# Patient Record
Sex: Male | Born: 1970 | Race: Black or African American | Hispanic: No | Marital: Married | Smoking: Never smoker
Health system: Southern US, Community
[De-identification: ages and names within clinical notes are randomized; demographics above are authoritative.]

## PROBLEM LIST (undated history)

## (undated) DIAGNOSIS — R569 Unspecified convulsions: Secondary | ICD-10-CM

## (undated) DIAGNOSIS — T7840XA Allergy, unspecified, initial encounter: Secondary | ICD-10-CM

## (undated) HISTORY — DX: Allergy, unspecified, initial encounter: T78.40XA

## (undated) HISTORY — DX: Unspecified convulsions: R56.9

---

## 2015-09-28 ENCOUNTER — Ambulatory Visit (INDEPENDENT_AMBULATORY_CARE_PROVIDER_SITE_OTHER): Admitting: Family Medicine

## 2015-09-28 ENCOUNTER — Ambulatory Visit (INDEPENDENT_AMBULATORY_CARE_PROVIDER_SITE_OTHER): Payer: 59 | Admitting: Family Medicine

## 2015-09-28 VITALS — BP 118/80 | HR 66 | Temp 98.7°F | Resp 18 | Ht 72.75 in | Wt 216.4 lb

## 2015-09-28 DIAGNOSIS — R5381 Other malaise: Secondary | ICD-10-CM

## 2015-09-28 DIAGNOSIS — S39012A Strain of muscle, fascia and tendon of lower back, initial encounter: Secondary | ICD-10-CM

## 2015-09-28 DIAGNOSIS — R509 Fever, unspecified: Secondary | ICD-10-CM | POA: Diagnosis not present

## 2015-09-28 DIAGNOSIS — R05 Cough: Secondary | ICD-10-CM

## 2015-09-28 DIAGNOSIS — M545 Low back pain: Secondary | ICD-10-CM

## 2015-09-28 DIAGNOSIS — R059 Cough, unspecified: Secondary | ICD-10-CM

## 2015-09-28 DIAGNOSIS — J111 Influenza due to unidentified influenza virus with other respiratory manifestations: Secondary | ICD-10-CM | POA: Diagnosis not present

## 2015-09-28 LAB — POCT CBC
Granulocyte percent: 71.8 %G (ref 37–80)
HEMATOCRIT: 40 % — AB (ref 43.5–53.7)
Hemoglobin: 13.9 g/dL — AB (ref 14.1–18.1)
LYMPH, POC: 1.2 (ref 0.6–3.4)
MCH, POC: 31.1 pg (ref 27–31.2)
MCHC: 34.8 g/dL (ref 31.8–35.4)
MCV: 89.5 fL (ref 80–97)
MID (CBC): 0.4 (ref 0–0.9)
MPV: 6.6 fL (ref 0–99.8)
POC GRANULOCYTE: 4 (ref 2–6.9)
POC LYMPH %: 20.8 % (ref 10–50)
POC MID %: 7.4 % (ref 0–12)
Platelet Count, POC: 204 10*3/uL (ref 142–424)
RBC: 4.47 M/uL — AB (ref 4.69–6.13)
RDW, POC: 13.6 %
WBC: 5.6 10*3/uL (ref 4.6–10.2)

## 2015-09-28 LAB — POCT RAPID STREP A (OFFICE): RAPID STREP A SCREEN: NEGATIVE

## 2015-09-28 LAB — POCT INFLUENZA A/B
INFLUENZA A, POC: NEGATIVE
INFLUENZA B, POC: NEGATIVE

## 2015-09-28 MED ORDER — OSELTAMIVIR PHOSPHATE 75 MG PO CAPS: 75.0000 mg | ORAL_CAPSULE | Freq: Two times a day (BID) | ORAL | Status: DC

## 2015-09-28 MED ORDER — HYDROCODONE-HOMATROPINE 5-1.5 MG/5ML PO SYRP: 5.0000 mL | ORAL_SOLUTION | Freq: Three times a day (TID) | ORAL | Status: DC | PRN

## 2015-09-28 MED ORDER — IBUPROFEN 800 MG PO TABS: 800.0000 mg | ORAL_TABLET | Freq: Three times a day (TID) | ORAL | Status: DC | PRN

## 2015-09-28 NOTE — Progress Notes (Signed)
Donald Rodgers 07/03/1971 45 y.o.   Chief Complaint  Patient presents with  . Back Injury    Pt. was lifting heavy mail at work and felt a pull in his lower back     Date of Injury: 09/28/15  History of Present Illness:  Presents for evaluation of work-related complaint. He was at work today and lifted some mail- he is used to lifting a lot at work and did not think too much about it.  He lifted a tray of mail and noted that he felt a little strange in his back.  When he went to bend down and put the mail tray into a hamper he had worsening pain.  He was then standing up casing his mail and felt a strange tightening in his back.  He tried to keep on going but his back hurt too bad so he came in to be seen The pain is in the mid to right lower back.  It does not go into his legs.  It is most painful when he is sitting.   Driving is painful No leg numbness or weakness from this accident.   No history of back trouble  He is very active and runs for exercise, he is quite fit generally  He took some ibuprofen earlier today  ROS    Allergies  Allergen Reactions  . Sulfur      Current medications reviewed and updated. Past medical history, family history, social history have been reviewed and updated.   Physical Exam Filed Vitals:   09/28/15 1613  BP: 118/80  Pulse: 66  Temp: 98.7 F (37.1 C)  Resp: 18   GEN: WDWN, NAD, Non-toxic, A & O x 3, looks well, fit build HEENT: Atraumatic, Normocephalic. Neck supple. No masses, No LAD. Ears and Nose: No external deformity. CV: RRR, No M/G/R. No JVD. No thrill. No extra heart sounds. PULM: CTA B, no wheezes, crackles, rhonchi. No retractions. No resp. distress. No accessory muscle use. EXTR: No c/c/e NEURO Normal gait.  PSYCH: Normally interactive. Conversant. Not depressed or anxious appearing.  Calm demeanor.  He indicates the bilateral lower back muscles as the area of tenderness  Spinal flexion is limited by pain,  Normal  BLE strength, sensation and DTR.  He does not have saddle anesthesia   Assessment and Plan:  Lumbar strain, initial encounter - Plan: ibuprofen (ADVIL,MOTRIN) 800 MG tablet  He will be out of work this weekend and will recheck on Monday Ibuprofen as neededfor pain

## 2015-09-28 NOTE — Progress Notes (Signed)
[facility] [address] [place] [address] [phone]  Date:  09/28/2015   Name:  Donald Rodgers   DOB:  07-07-1971   MRN:  [id number]  PCP:  No primary care provider on file.    Chief Complaint: URI   History of Present Illness:  Donald Rodgers is a 45 y.o. very pleasant male patient who presents with the following:  He is here as a new patent.  He has epilepsy- he uses keppra and has not had a seizure in several years.  He drives, etc.  Works for the [employer] Otherwise he is generally healthy His neurologist is in [facility]  He has been ill with cough, sweating, chills, phlegm in his throat.  He has been sick for about 2 days.  Sx came on him quite suddenly He did have a fever to 102 earlier today.   He feels achy and exhauseted.  He did take some ibuprofen just about an hour ago He felt nauseated but has not vomited.  Poor appetite.   His daughter was ill recently with a stomach bug of some type.  She is now better  There are no active problems to display for this patient.   Past Medical History  Diagnosis Date  . Allergy   . Seizures (HCC)     History reviewed. No pertinent past surgical history.  Social History  Substance Use Topics  . Smoking status: Never Smoker   . Smokeless tobacco: None  . Alcohol Use: No    Family History  Problem Relation Age of Onset  . Stroke Mother   . Cancer Father     Allergies  Allergen Reactions  . Sulfur     Medication list has been reviewed and updated.  No current outpatient prescriptions on file prior to visit.   No current facility-administered medications on file prior to visit.    Review of Systems:  As per HPI- otherwise negative.   Physical Examination: Filed Vitals:   09/28/15 1611  BP: 118/80  Pulse: 66  Temp: 98.7 F (37.1 C)  Resp: 18   Filed Vitals:   09/28/15 1611  Height: 6' 0.75" (1.848 m)  Weight: 216 lb 6.4 oz (98.158 kg)   Body mass index is  28.74 kg/(m^2). Ideal Body Weight: Weight in (lb) to have BMI = 25: 187.8  GEN: WDWN, NAD, Non-toxic, A & O x 3, looks well,fit build HEENT: Atraumatic, Normocephalic. Neck supple. No masses, No LAD.  Bilateral TM wnl, oropharynx normal.  PEERL,EOMI.   Ears and Nose: No external deformity. CV: RRR, No M/G/R. No JVD. No thrill. No extra heart sounds. PULM: CTA B, no wheezes, crackles, rhonchi. No retractions. No resp. distress. No accessory muscle use. ABD: S, NT, ND. No rebound. No HSM.  Belly benign EXTR: No c/c/e NEURO Normal gait.  PSYCH: Normally interactive. Conversant. Not depressed or anxious appearing.  Calm demeanor.   Results for orders placed or performed in visit on 09/28/15  POCT Influenza A/B  Result Value Ref Range   Influenza A, POC Negative Negative   Influenza B, POC Negative Negative  POCT rapid strep A  Result Value Ref Range   Rapid Strep A Screen Negative Negative  POCT CBC  Result Value Ref Range   WBC 5.6 4.6 - 10.2 K/uL   Lymph, poc 1.2 0.6 - 3.4   POC LYMPH PERCENT 20.8 10 - 50 %L   MID (cbc) 0.4 0 - 0.9   POC  MID % 7.4 0 - 12 %M   POC Granulocyte 4.0 2 - 6.9   Granulocyte percent 71.8 37 - 80 %G   RBC 4.47 (A) 4.69 - 6.13 M/uL   Hemoglobin 13.9 (A) 14.1 - 18.1 g/dL   HCT, POC 16.1 (A) 09.6 - 53.7 %   MCV 89.5 80 - 97 fL   MCH, POC 31.1 27 - 31.2 pg   MCHC 34.8 31.8 - 35.4 g/dL   RDW, POC 04.5 %   Platelet Count, POC 204 142 - 424 K/uL   MPV 6.6 0 - 99.8 fL    Assessment and Plan: Influenza with respiratory manifestation - Plan: oseltamivir (TAMIFLU) 75 MG capsule  Cough - Plan: POCT Influenza A/B, POCT rapid strep A, POCT CBC, HYDROcodone-homatropine (HYCODAN) 5-1.5 MG/5ML syrup  Malaise - Plan: POCT Influenza A/B, POCT rapid strep A, POCT CBC  Fever, unspecified - Plan: POCT Influenza A/B, POCT rapid strep A, POCT CBC  Here today with likely flu. Will treat with tamiflu and hycodan/  He will rest, drink fluids and let me know if not better  soon   Signed Abbe Amsterdam, MD

## 2015-09-28 NOTE — Patient Instructions (Signed)
You will be out of work this weekend- please see [place] on Monday for a recheck.  Use the ibuprofen 800 mg as needed for back pain.  Do not take other ibuprofen or aleve with this medication!  Take it easy on your back and rest

## 2015-09-28 NOTE — Patient Instructions (Signed)
You likely have the flu  Rest and drink plenty of fluids Use the tamiflu as directed and the hycodan cough syrup as needed for cough and pain The cough syrup will make you sleepy so do not use it when you need to drive  If you are not feeling better in the next few days or if you start to get worse please let us know- the flu can lead to complications such as pneumonia so do seek care if you get worse!    You can also use NSAIDS such as ibuprofen and/ or tylenol for fever or pain

## 2015-10-01 ENCOUNTER — Ambulatory Visit

## 2015-10-01 ENCOUNTER — Ambulatory Visit (INDEPENDENT_AMBULATORY_CARE_PROVIDER_SITE_OTHER): Admitting: Family Medicine

## 2015-10-01 VITALS — BP 106/66 | HR 62 | Temp 98.7°F | Resp 18 | Ht 72.0 in | Wt 216.0 lb

## 2015-10-01 DIAGNOSIS — X503XXA Overexertion from repetitive movements, initial encounter: Secondary | ICD-10-CM

## 2015-10-01 DIAGNOSIS — S39012A Strain of muscle, fascia and tendon of lower back, initial encounter: Secondary | ICD-10-CM

## 2015-10-01 DIAGNOSIS — S39012D Strain of muscle, fascia and tendon of lower back, subsequent encounter: Secondary | ICD-10-CM

## 2015-10-01 DIAGNOSIS — M545 Low back pain, unspecified: Secondary | ICD-10-CM

## 2015-10-01 DIAGNOSIS — M5442 Lumbago with sciatica, left side: Secondary | ICD-10-CM

## 2015-10-01 MED ORDER — CYCLOBENZAPRINE HCL 10 MG PO TABS: 10.0000 mg | ORAL_TABLET | Freq: Two times a day (BID) | ORAL | Status: DC | PRN

## 2015-10-01 NOTE — Patient Instructions (Addendum)
You do have a low back strain. I think this will improve but we do not want you lifting too much.   Use the flexeril as needed for the back pain- however this will make you sleepy!  Do not use it when you need to drive and do not combine with the cough syrup Please see [place] in one week for a recheck Heat and gentle exercise may also be helpful    Because you received an x-ray today, you will receive an invoice from [facility]. Please contact [facility] at [phone] with questions or concerns regarding your invoice. Our billing staff will not be able to assist you with those questions.

## 2015-10-01 NOTE — Progress Notes (Signed)
Donald Rodgers [birth year] 45 y.o.   Chief Complaint  Patient presents with  . Follow-up    workers comp. back injury, brought in paper to gt clearance      Date of Injury: 09/28/15  History of Present Illness:  Presents for evaluation of work-related complaint.  Seen on 1/27 following a lumbar strain. He notes that his back is improved but not yet well. The worst sx are in his left lower back.  He may have some radiation into his left leg.  He had a couple of episodes where the pain did intensify momentarily and nearly took the strength from the leg, but otherwise he does not have any weakness.  No numbness of the leg  No bowel or bladder dysfunction The pain is improved and he would like to get back to work but thinks he will need light duty for a while at least  ROS    Allergies  Allergen Reactions  . Sulfur      Current medications reviewed and updated. Past medical history, family history, social history have been reviewed and updated.   Physical Exam   Filed Vitals:   10/01/15 1243  BP: 106/66  Pulse: 62  Temp: 98.7 F (37.1 C)  Resp: 18   GEN: WDWN, NAD, Non-toxic, A & O x 3, well and healthy appearing HEENT: Atraumatic, Normocephalic. Neck supple. No masses, No LAD. Ears and Nose: No external deformity. CV: RRR, No M/G/R. No JVD. No thrill. No extra heart sounds. PULM: CTA B, no wheezes, crackles, rhonchi. No retractions. No resp. distress. No accessory muscle use. EXTR: No c/c/e NEURO Normal gait.  PSYCH: Normally interactive. Conversant. Not depressed or anxious appearing.  Calm demeanor.  Left lower back: he has tenderness and spasm of the mid lumbar muscles.  Slightly restricted flexion, normal extension Normal BLE strength, sensation and DTR, negative SLR bilaterally    [facility] reading (PRIMARY) by  Dr. Patsy Lager. Lumbar spine: minimal spurring at L4, OW negative  Dg Lumbar Spine Complete  10/01/2015  CLINICAL DATA:  Right-sided low back pain without  sciatica. EXAM: LUMBAR SPINE - COMPLETE 4+ VIEW COMPARISON:  None. FINDINGS: There is no evidence of lumbar spine fracture. Alignment is normal. Intervertebral disc spaces are maintained. No facet arthritis. IMPRESSION: No significant abnormalities of the lumbar spine. Electronically Signed   By: Francene Boyers M.D.   On: 10/01/2015 14:02      Assessment and Plan: Repetitive strain injury of lower back, initial encounter - Plan: cyclobenzaprine (FLEXERIL) 10 MG tablet  Left-sided low back pain with left-sided sciatica - Plan: DG Lumbar Spine Complete   May RTW but given a 25 lb lifting restriction.  Recheck in 1 week Flexeril as needed for pain and spasm- cautioned regarding sedation

## 2015-10-02 ENCOUNTER — Telehealth: Payer: Self-pay

## 2015-10-02 NOTE — Telephone Encounter (Signed)
Patient states it was [place]-17 form that dr copland filled out and it is line 12 that would need to say return on 10/03/15. Originally I thought patient was given a work note and needed a new one. I advised patient if he needed the form revised he would have to bring in to the office. I told him I couldn't guarantee it would be done the same day he brought it. He states he has to have it for tomorrow when he returns to work. Patients call back number is [phone]

## 2015-10-02 NOTE — Telephone Encounter (Signed)
Allie fixed form for pt

## 2015-10-02 NOTE — Telephone Encounter (Signed)
Patient was seen by dr copland for a wc injury of his lower back with [employer]. Patient stated Dr copland gave him a return to work note for 10/02/15. Patient thought today he was off but found out he was due to go back in. He is requesting dr copland to write him a new work note stating he can return on 10/03/15 instead of 10/02/15. Patient stated he felt he needed one more day to rest. Patient stated he would pick it up when ready and would like to get it today. Please call patient at [phone] when ready.

## 2015-10-16 ENCOUNTER — Ambulatory Visit (INDEPENDENT_AMBULATORY_CARE_PROVIDER_SITE_OTHER): Admitting: Family Medicine

## 2015-10-16 VITALS — BP 110/72 | HR 63 | Temp 98.1°F | Resp 16 | Ht 72.5 in | Wt 212.0 lb

## 2015-10-16 DIAGNOSIS — M545 Low back pain: Secondary | ICD-10-CM | POA: Diagnosis not present

## 2015-10-16 DIAGNOSIS — S39012D Strain of muscle, fascia and tendon of lower back, subsequent encounter: Secondary | ICD-10-CM

## 2015-10-16 DIAGNOSIS — G40909 Epilepsy, unspecified, not intractable, without status epilepticus: Secondary | ICD-10-CM | POA: Insufficient documentation

## 2015-10-16 MED ORDER — METHOCARBAMOL 500 MG PO TABS: 500.0000 mg | ORAL_TABLET | Freq: Three times a day (TID) | ORAL | Status: DC | PRN

## 2015-10-16 MED ORDER — METAXALONE 800 MG PO TABS: 800.0000 mg | ORAL_TABLET | Freq: Three times a day (TID) | ORAL | Status: DC

## 2015-10-16 NOTE — Progress Notes (Signed)
Donald Rodgers May 03, 1971 45 y.o.   Chief Complaint  Patient presents with  . Follow-up    workers comp      Date of Injury: 09/28/2015  History of Present Illness:  Presents for re-evaluation of work-related complaint. Lumbar stain occurred at work on 1/27- he was seen for a recheck on 1/30 and was allowed to RTW on a lifting restriction. Rx for flexeril as needed He was supposed to come in for a recheck last week but was not able to due to work demands  He feels ok when he is walking, but will have pain with position change (sitting to standing) and with prolonged standing.  He feels like his pain is actually increased in the left lower back.  He also notes some pain along his left side and in the left hip flexors.  He did run out of his ibuprofen about 2 days ago and is not sure if this exacerbated his sx  He is not taking the flexeril as it seemed to keep him awake and made him feel strange.   He worked a long week last week- approx 60 hours.  He followed his lifting restriction but perhaps still over -did it   No weakness or numbness of the leg.  No bowel or bladder dysfunction He is otherwise feeling well  ROS    Allergies  Allergen Reactions  . Sulfur      Current medications reviewed and updated. Past medical history, family history, social history have been reviewed and updated.   Physical Exam  Filed Vitals:   10/16/15 0906  BP: 110/72  Pulse: 63  Temp: 98.1 F (36.7 C)  Resp: 16   GEN: WDWN, NAD, Non-toxic, A & O x 3, looks well and healthy HEENT: Atraumatic, Normocephalic. Neck supple. No masses, No LAD. Ears and Nose: No external deformity. CV: RRR, No M/G/R. No JVD. No thrill. No extra heart sounds. PULM: CTA B, no wheezes, crackles, rhonchi. No retractions. No resp. distress. No accessory muscle use. ABD: S, ND, +BS. No rebound. No HSM.  He has what seems to be muscular tenderness of the left sided oblique muscles Tenderness in the left lower  back and glute.  Mild tenderness of the left sided hip flexor muscles Normal strength of the BLE with normal DTR and negative SLR EXTR: No c/c/e NEURO Normal gait.  PSYCH: Normally interactive. Conversant. Not depressed or anxious appearing.  Calm demeanor.    Assessment and Plan: Lumbar strain, subsequent encounter - Plan: metaxalone (SKELAXIN) 800 MG tablet, DISCONTINUED: methocarbamol (ROBAXIN) 500 MG tablet  Here today to follow-up on his lumbar strain.  He now also seems to have some strain of the left obliques and hip flexors likely due to change in body mechanics.   The flexeril seems to be too strong for him so will have him try skelaxin instead.  He has over done it at work and needs to rest- will take him out for the next 2 days with a recheck here on Friday Also discussed non MSK other causes of side pain and offered a separate visit to eval.  He declines at this time but will watch out for any concerning sx

## 2015-10-16 NOTE — Patient Instructions (Addendum)
We are going to try skelaxin instead of the flexeril as a muscle relaxer. I hope that this will work better for you.   We will keep you out of work for the next few days so you can rest- recheck here on Friday 2/17.   I think that your abdominal symptoms are muscular- however if you develop any worsening or change of these symptoms please come back right away for a recheck

## 2015-10-19 ENCOUNTER — Ambulatory Visit (INDEPENDENT_AMBULATORY_CARE_PROVIDER_SITE_OTHER): Admitting: Family Medicine

## 2015-10-19 VITALS — BP 118/76 | HR 60 | Temp 98.1°F | Resp 16 | Ht 72.5 in | Wt 211.0 lb

## 2015-10-19 DIAGNOSIS — S39012D Strain of muscle, fascia and tendon of lower back, subsequent encounter: Secondary | ICD-10-CM | POA: Diagnosis not present

## 2015-10-19 DIAGNOSIS — S39012A Strain of muscle, fascia and tendon of lower back, initial encounter: Secondary | ICD-10-CM

## 2015-10-19 DIAGNOSIS — M545 Low back pain, unspecified: Secondary | ICD-10-CM

## 2015-10-19 MED ORDER — IBUPROFEN 800 MG PO TABS: 800.0000 mg | ORAL_TABLET | Freq: Three times a day (TID) | ORAL | Status: DC | PRN

## 2015-10-19 NOTE — Patient Instructions (Signed)
Continue Skelaxin  Take ibuprofen 800 mg 3 times daily also as needed for pain  Return in one week  Do gentle stretching exercises. Physical therapy referral will be instituted, but may take some time to get approved. Do not just sit in one place for prolonged stretches, but get up and store around. However avoid doing activities at home that strain your back.

## 2015-10-19 NOTE — Progress Notes (Signed)
Patient ID: Donald Rodgers, male    DOB: 12-28-1970  Age: 45 y.o. MRN: [id number]  Chief Complaint  Patient presents with  . 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 He has been off of work since Tuesday the 13th when he was here, but his injury occurred on January 27. 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 He has persisted and hurting in his left low back. He thinks he may have gone back to work too soon. However now he continues to hurt despite taking the muscle relaxant. He has pain when he is just sitting in a still position and his back tightens up. Standing straight and still also causes pain. However when he is just walking he feels better. His job entails walking, with some inclines been hurting worse. If he were to work light duty he would be just sitting at a desk. This aggravates the pain right now. He is taking the Skelaxin which helps better than the Flexeril did for him.   Current allergies, medications, problem list, past/family and social histories reviewed.  Objective:  BP 118/76 mmHg  Pulse 60  Temp(Src) 98.1 F (36.7 C) (Oral)  Resp 16  Ht 6' 0.5" (1.842 m)  Wt 211 lb (95.709 kg)  BMI 28.21 kg/m2  SpO2 98%  No major acute distress. Obvious discomfort when he gets up or down. He has pain on anterior flexion even to a small degree, 10 or 15, starts causing pain. His back is somewhat tender in the left low back to palpation and percussion. Straight leg raising test is essentially negative though on 90 raise of the right leg he had some contralateral pain on the left side.  Assessment & Plan:   Assessment: 1. Lumbar strain, subsequent encounter   2. Left-sided low back pain without sciatica   3. Lumbar strain, initial encounter       Plan: Lumbar strain which is going to take some time. I'm afraid light-duty may cause him to tighten up more so I'm just going to leave him off for the  next week and see how he's doing. If he gets dramatically better he is to come in sooner, otherwise return in one week.  Orders Placed This Encounter  Procedures  . Ambulatory referral to Physical Therapy    Referral Priority:  Routine    Referral Type:  Physical Medicine    Referral Reason:  Specialty Services Required    Requested Specialty:  Physical Therapy    Number of Visits Requested:  1    Meds ordered this encounter  Medications  . ibuprofen (ADVIL,MOTRIN) 800 MG tablet    Sig: Take 1 tablet (800 mg total) by mouth every 8 (eight) hours as needed.    Dispense:  40 tablet    Refill:  0         Patient Instructions  Continue Skelaxin  Take ibuprofen 800 mg 3 times daily also as needed for pain  Return in one week  Do gentle stretching exercises. Physical therapy referral will be instituted, but may take some time to get approved. Do not just sit in one place for prolonged stretches, but get up and store around. However avoid doing activities at home that strain your back.     No Follow-up on file.   HOPPER,DAVID, MD 10/19/2015

## 2015-10-26 ENCOUNTER — Ambulatory Visit (INDEPENDENT_AMBULATORY_CARE_PROVIDER_SITE_OTHER): Admitting: Family Medicine

## 2015-10-26 VITALS — BP 142/78 | HR 57 | Temp 98.5°F | Resp 14 | Ht 72.25 in | Wt 216.0 lb

## 2015-10-26 DIAGNOSIS — Y990 Civilian activity done for income or pay: Secondary | ICD-10-CM

## 2015-10-26 DIAGNOSIS — M545 Low back pain: Secondary | ICD-10-CM | POA: Diagnosis not present

## 2015-10-26 DIAGNOSIS — S39012D Strain of muscle, fascia and tendon of lower back, subsequent encounter: Secondary | ICD-10-CM

## 2015-10-26 NOTE — Progress Notes (Signed)
   Subjective:    Patient ID: Donald Rodgers, male    DOB: [birth year], 45 y.o.   MRN: [id number]  10/26/2015  Follow-up   HPI This 45 y.o. male presents for evaluation of work related lumbar strain.  Feeling better.  Off for one week. Flexibility is better.  Has been taking it easy.  Has been on [place].  Cannot lift heavy amounts; has a 47 year old who weighs 50 pounds and unable to to that.  Referred to physical therapy; called physical therapy for an appointment.  Has not started physical therapy yet.  L>R side lower back pain; radiation into L groin.  No n/t/burning in legs that is new.  No weakness.  Normal b/b function.  Pain in back with b.m. And with cough.  No saddle paresthesias.  Taking Ibuprofen  tid; Robaxin tid yet causes drowsiness.  Pain much improved.  Pain currently 4/10.  Initial injury pain was 9/10.  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   Review of Systems  Constitutional: Negative for fever, chills, diaphoresis and fatigue.  Genitourinary: Negative for decreased urine volume and difficulty urinating.  Musculoskeletal: Positive for back pain.  Neurological: Negative for weakness and numbness.       Objective:    BP 142/78 mmHg  Pulse 57  Temp(Src) 98.5 F (36.9 C)  Resp 14  Ht 6' 0.25" (1.835 m)  Wt 216 lb (97.977 kg)  BMI 29.10 kg/m2  SpO2 99% Physical Exam  Constitutional: He is oriented to person, place, and time. He appears well-developed and well-nourished. No distress.  HENT:  Head: Normocephalic and atraumatic.  Eyes: Conjunctivae and EOM are normal. Pupils are equal, round, and reactive to light.  Neck: Normal range of motion. Neck supple. Carotid bruit is not present. No thyromegaly present.  Cardiovascular: Normal rate, regular rhythm, normal heart sounds and intact distal pulses.  Exam reveals no gallop and no friction rub.   No murmur heard. Pulmonary/Chest: Effort normal and breath sounds normal. He has no wheezes. He has  no rales.  Musculoskeletal:       Lumbar back: He exhibits pain. He exhibits normal range of motion, no tenderness, no bony tenderness, no swelling and no spasm.  Lumbar spine:  Non-tender midline; non-tender paraspinal regions B.  Straight leg raises negative B; toe and heel walking intact; marching intact; motor 5/5 BLE.  Full ROM lumbar spine without limitation.   Lymphadenopathy:    He has no cervical adenopathy.  Neurological: He is alert and oriented to person, place, and time. No cranial nerve deficit.  Skin: Skin is warm and dry. No rash noted. He is not diaphoretic.  Psychiatric: He has a normal mood and affect. His behavior is normal.  Nursing note and vitals reviewed.       Assessment & Plan:   1. Lumbar strain, subsequent encounter   2. Work related injury    -improving with physical therapy. -continue physical therapy and current medication. -continue light duty as outlined by employer's workman's compensation form. -avoid heavy lifting> 25 pounds; avoid repetitive standing/sitting/bending/twisting/rotating. -RTC in 2 weeks.   No orders of the defined types were placed in this encounter.   No orders of the defined types were placed in this encounter.    No Follow-up on file.    Paschal Blanton Paulita Fujita, M.D. [facility] [address] [place], [place]  [address] [phone] phone [phone] fax

## 2015-10-26 NOTE — Patient Instructions (Signed)
Low Back Sprain With Rehab A sprain is an injury in which a ligament is torn. The ligaments of the lower back are vulnerable to sprains. However, they are strong and require great force to be injured. These ligaments are important for stabilizing the spinal column. Sprains are classified into three categories. Grade 1 sprains cause pain, but the tendon is not lengthened. Grade 2 sprains include a lengthened ligament, due to the ligament being stretched or partially ruptured. With grade 2 sprains there is still function, although the function may be decreased. Grade 3 sprains involve a complete tear of the tendon or muscle, and function is usually impaired. SYMPTOMS   Severe pain in the lower back.  Sometimes, a feeling of a "pop," "snap," or tear, at the time of injury.  Tenderness and sometimes swelling at the injury site.  Uncommonly, bruising (contusion) within 48 hours of injury.  Muscle spasms in the back. CAUSES  Low back sprains occur when a force is placed on the ligaments that is greater than they can handle. Common causes of injury include:  Performing a stressful act while off-balance.  Repetitive stressful activities that involve movement of the lower back.  Direct hit (trauma) to the lower back. RISK INCREASES WITH:  Contact sports (football, wrestling).  Collisions (major skiing accidents).  Sports that require throwing or lifting (baseball, weightlifting).  Sports involving twisting of the spine (gymnastics, diving, tennis, golf).  Poor strength and flexibility.  Inadequate protection.  Previous back injury or surgery (especially fusion). PREVENTION  Wear properly fitted and padded protective equipment.  Warm up and stretch properly before activity.  Allow for adequate recovery between workouts.  Maintain physical fitness:  Strength, flexibility, and endurance.  Cardiovascular fitness.  Maintain a healthy body weight. PROGNOSIS  If treated properly,  low back sprains usually heal with non-surgical treatment. The length of time for healing depends on the severity of the injury.  RELATED COMPLICATIONS   Recurring symptoms, resulting in a chronic problem.  Chronic inflammation and pain in the low back.  Delayed healing or resolution of symptoms, especially if activity is resumed too soon.  Prolonged impairment.  Unstable or arthritic joints of the low back. TREATMENT  Treatment first involves the use of ice and medicine, to reduce pain and inflammation. The use of strengthening and stretching exercises may help reduce pain with activity. These exercises may be performed at home or with a therapist. Severe injuries may require referral to a therapist for further evaluation and treatment, such as ultrasound. Your caregiver may advise that you wear a back brace or corset, to help reduce pain and discomfort. Often, prolonged bed rest results in greater harm then benefit. Corticosteroid injections may be recommended. However, these should be reserved for the most serious cases. It is important to avoid using your back when lifting objects. At night, sleep on your back on a firm mattress, with a pillow placed under your knees. If non-surgical treatment is unsuccessful, surgery may be needed.  MEDICATION   If pain medicine is needed, nonsteroidal anti-inflammatory medicines (aspirin and ibuprofen), or other minor pain relievers (acetaminophen), are often advised.  Do not take pain medicine for 7 days before surgery.  Prescription pain relievers may be given, if your caregiver thinks they are needed. Use only as directed and only as much as you need.  Ointments applied to the skin may be helpful.  Corticosteroid injections may be given by your caregiver. These injections should be reserved for the most serious cases, because   they may only be given a certain number of times. HEAT AND COLD  Cold treatment (icing) should be applied for 10 to 15  minutes every 2 to 3 hours for inflammation and pain, and immediately after activity that aggravates your symptoms. Use ice packs or an ice massage.  Heat treatment may be used before performing stretching and strengthening activities prescribed by your caregiver, physical therapist, or athletic trainer. Use a heat pack or a warm water soak. SEEK MEDICAL CARE IF:   Symptoms get worse or do not improve in 2 to 4 weeks, despite treatment.  You develop numbness or weakness in either leg.  You lose bowel or bladder function.  Any of the following occur after surgery: fever, increased pain, swelling, redness, drainage of fluids, or bleeding in the affected area.  New, unexplained symptoms develop. (Drugs used in treatment may produce side effects.) EXERCISES  RANGE OF MOTION (ROM) AND STRETCHING EXERCISES - Low Back Sprain Most people with lower back pain will find that their symptoms get worse with excessive bending forward (flexion) or arching at the lower back (extension). The exercises that will help resolve your symptoms will focus on the opposite motion.  Your physician, physical therapist or athletic trainer will help you determine which exercises will be most helpful to resolve your lower back pain. Do not complete any exercises without first consulting with your caregiver. Discontinue any exercises which make your symptoms worse, until you speak to your caregiver. If you have pain, numbness or tingling which travels down into your buttocks, leg or foot, the goal of the therapy is for these symptoms to move closer to your back and eventually resolve. Sometimes, these leg symptoms will get better, but your lower back pain may worsen. This is often an indication of progress in your rehabilitation. Be very alert to any changes in your symptoms and the activities in which you participated in the 24 hours prior to the change. Sharing this information with your caregiver will allow him or her to most  efficiently treat your condition. These exercises may help you when beginning to rehabilitate your injury. Your symptoms may resolve with or without further involvement from your physician, physical therapist or athletic trainer. While completing these exercises, remember:   Restoring tissue flexibility helps normal motion to return to the joints. This allows healthier, less painful movement and activity.  An effective stretch should be held for at least 30 seconds.  A stretch should never be painful. You should only feel a gentle lengthening or release in the stretched tissue. FLEXION RANGE OF MOTION AND STRETCHING EXERCISES: STRETCH - Flexion, Single Knee to Chest   Lie on a firm bed or floor with both legs extended in front of you.  Keeping one leg in contact with the floor, bring your opposite knee to your chest. Hold your leg in place by either grabbing behind your thigh or at your knee.  Pull until you feel a gentle stretch in your low back. Hold __________ seconds.  Slowly release your grasp and repeat the exercise with the opposite side. Repeat __________ times. Complete this exercise __________ times per day.  STRETCH - Flexion, Double Knee to Chest  Lie on a firm bed or floor with both legs extended in front of you.  Keeping one leg in contact with the floor, bring your opposite knee to your chest.  Tense your stomach muscles to support your back and then lift your other knee to your chest. Hold your legs in   place by either grabbing behind your thighs or at your knees.  Pull both knees toward your chest until you feel a gentle stretch in your low back. Hold __________ seconds.  Tense your stomach muscles and slowly return one leg at a time to the floor. Repeat __________ times. Complete this exercise __________ times per day.  STRETCH - Low Trunk Rotation  Lie on a firm bed or floor. Keeping your legs in front of you, bend your knees so they are both pointed toward the  ceiling and your feet are flat on the floor.  Extend your arms out to the side. This will stabilize your upper body by keeping your shoulders in contact with the floor.  Gently and slowly drop both knees together to one side until you feel a gentle stretch in your low back. Hold for __________ seconds.  Tense your stomach muscles to support your lower back as you bring your knees back to the starting position. Repeat the exercise to the other side. Repeat __________ times. Complete this exercise __________ times per day  EXTENSION RANGE OF MOTION AND FLEXIBILITY EXERCISES: STRETCH - Extension, Prone on Elbows   Lie on your stomach on the floor, a bed will be too soft. Place your palms about shoulder width apart and at the height of your head.  Place your elbows under your shoulders. If this is too painful, stack pillows under your chest.  Allow your body to relax so that your hips drop lower and make contact more completely with the floor.  Hold this position for __________ seconds.  Slowly return to lying flat on the floor. Repeat __________ times. Complete this exercise __________ times per day.  RANGE OF MOTION - Extension, Prone Press Ups  Lie on your stomach on the floor, a bed will be too soft. Place your palms about shoulder width apart and at the height of your head.  Keeping your back as relaxed as possible, slowly straighten your elbows while keeping your hips on the floor. You may adjust the placement of your hands to maximize your comfort. As you gain motion, your hands will come more underneath your shoulders.  Hold this position __________ seconds.  Slowly return to lying flat on the floor. Repeat __________ times. Complete this exercise __________ times per day.  RANGE OF MOTION- Quadruped, Neutral Spine   Assume a hands and knees position on a firm surface. Keep your hands under your shoulders and your knees under your hips. You may place padding under your knees for  comfort.  Drop your head and point your tailbone toward the ground below you. This will round out your lower back like an angry cat. Hold this position for __________ seconds.  Slowly lift your head and release your tail bone so that your back sags into a large arch, like an old horse.  Hold this position for __________ seconds.  Repeat this until you feel limber in your low back.  Now, find your "sweet spot." This will be the most comfortable position somewhere between the two previous positions. This is your neutral spine. Once you have found this position, tense your stomach muscles to support your low back.  Hold this position for __________ seconds. Repeat __________ times. Complete this exercise __________ times per day.  STRENGTHENING EXERCISES - Low Back Sprain These exercises may help you when beginning to rehabilitate your injury. These exercises should be done near your "sweet spot." This is the neutral, low-back arch, somewhere between fully rounded and   fully arched, that is your least painful position. When performed in this safe range of motion, these exercises can be used for people who have either a flexion or extension based injury. These exercises may resolve your symptoms with or without further involvement from your physician, physical therapist or athletic trainer. While completing these exercises, remember:   Muscles can gain both the endurance and the strength needed for everyday activities through controlled exercises.  Complete these exercises as instructed by your physician, physical therapist or athletic trainer. Increase the resistance and repetitions only as guided.  You may experience muscle soreness or fatigue, but the pain or discomfort you are trying to eliminate should never worsen during these exercises. If this pain does worsen, stop and make certain you are following the directions exactly. If the pain is still present after adjustments, discontinue the  exercise until you can discuss the trouble with your caregiver. STRENGTHENING - Deep Abdominals, Pelvic Tilt   Lie on a firm bed or floor. Keeping your legs in front of you, bend your knees so they are both pointed toward the ceiling and your feet are flat on the floor.  Tense your lower abdominal muscles to press your low back into the floor. This motion will rotate your pelvis so that your tail bone is scooping upwards rather than pointing at your feet or into the floor. With a gentle tension and even breathing, hold this position for __________ seconds. Repeat __________ times. Complete this exercise __________ times per day.  STRENGTHENING - Abdominals, Crunches   Lie on a firm bed or floor. Keeping your legs in front of you, bend your knees so they are both pointed toward the ceiling and your feet are flat on the floor. Cross your arms over your chest.  Slightly tip your chin down without bending your neck.  Tense your abdominals and slowly lift your trunk high enough to just clear your shoulder blades. Lifting higher can put excessive stress on the lower back and does not further strengthen your abdominal muscles.  Control your return to the starting position. Repeat __________ times. Complete this exercise __________ times per day.  STRENGTHENING - Quadruped, Opposite UE/LE Lift   Assume a hands and knees position on a firm surface. Keep your hands under your shoulders and your knees under your hips. You may place padding under your knees for comfort.  Find your neutral spine and gently tense your abdominal muscles so that you can maintain this position. Your shoulders and hips should form a rectangle that is parallel with the floor and is not twisted.  Keeping your trunk steady, lift your right hand no higher than your shoulder and then your left leg no higher than your hip. Make sure you are not holding your breath. Hold this position for __________ seconds.  Continuing to keep  your abdominal muscles tense and your back steady, slowly return to your starting position. Repeat with the opposite arm and leg. Repeat __________ times. Complete this exercise __________ times per day.  STRENGTHENING - Abdominals and Quadriceps, Straight Leg Raise   Lie on a firm bed or floor with both legs extended in front of you.  Keeping one leg in contact with the floor, bend the other knee so that your foot can rest flat on the floor.  Find your neutral spine, and tense your abdominal muscles to maintain your spinal position throughout the exercise.  Slowly lift your straight leg off the floor about 6 inches for a count of   15, making sure to not hold your breath.  Still keeping your neutral spine, slowly lower your leg all the way to the floor. Repeat this exercise with each leg __________ times. Complete this exercise __________ times per day. POSTURE AND BODY MECHANICS CONSIDERATIONS - Low Back Sprain Keeping correct posture when sitting, standing or completing your activities will reduce the stress put on different body tissues, allowing injured tissues a chance to heal and limiting painful experiences. The following are general guidelines for improved posture. Your physician or physical therapist will provide you with any instructions specific to your needs. While reading these guidelines, remember:  The exercises prescribed by your provider will help you have the flexibility and strength to maintain correct postures.  The correct posture provides the best environment for your joints to work. All of your joints have less wear and tear when properly supported by a spine with good posture. This means you will experience a healthier, less painful body.  Correct posture must be practiced with all of your activities, especially prolonged sitting and standing. Correct posture is as important when doing repetitive low-stress activities (typing) as it is when doing a single heavy-load  activity (lifting). RESTING POSITIONS Consider which positions are most painful for you when choosing a resting position. If you have pain with flexion-based activities (sitting, bending, stooping, squatting), choose a position that allows you to rest in a less flexed posture. You would want to avoid curling into a fetal position on your side. If your pain worsens with extension-based activities (prolonged standing, working overhead), avoid resting in an extended position such as sleeping on your stomach. Most people will find more comfort when they rest with their spine in a more neutral position, neither too rounded nor too arched. Lying on a non-sagging bed on your side with a pillow between your knees, or on your back with a pillow under your knees will often provide some relief. Keep in mind, being in any one position for a prolonged period of time, no matter how correct your posture, can still lead to stiffness. PROPER SITTING POSTURE In order to minimize stress and discomfort on your spine, you must sit with correct posture. Sitting with good posture should be effortless for a healthy body. Returning to good posture is a gradual process. Many people can work toward this most comfortably by using various supports until they have the flexibility and strength to maintain this posture on their own. When sitting with proper posture, your ears will fall over your shoulders and your shoulders will fall over your hips. You should use the back of the chair to support your upper back. Your lower back will be in a neutral position, just slightly arched. You may place a small pillow or folded towel at the base of your lower back for  support.  When working at a desk, create an environment that supports good, upright posture. Without extra support, muscles tire, which leads to excessive strain on joints and other tissues. Keep these recommendations in mind: CHAIR:  A chair should be able to slide under your desk  when your back makes contact with the back of the chair. This allows you to work closely.  The chair's height should allow your eyes to be level with the upper part of your monitor and your hands to be slightly lower than your elbows. BODY POSITION  Your feet should make contact with the floor. If this is not possible, use a foot rest.  Keep your ears   over your shoulders. This will reduce stress on your neck and low back. INCORRECT SITTING POSTURES  If you are feeling tired and unable to assume a healthy sitting posture, do not slouch or slump. This puts excessive strain on your back tissues, causing more damage and pain. Healthier options include:  Using more support, like a lumbar pillow.  Switching tasks to something that requires you to be upright or walking.  Talking a brief walk.  Lying down to rest in a neutral-spine position. PROLONGED STANDING WHILE SLIGHTLY LEANING FORWARD  When completing a task that requires you to lean forward while standing in one place for a long time, place either foot up on a stationary 2-4 inch high object to help maintain the best posture. When both feet are on the ground, the lower back tends to lose its slight inward curve. If this curve flattens (or becomes too large), then the back and your other joints will experience too much stress, tire more quickly, and can cause pain. CORRECT STANDING POSTURES Proper standing posture should be assumed with all daily activities, even if they only take a few moments, like when brushing your teeth. As in sitting, your ears should fall over your shoulders and your shoulders should fall over your hips. You should keep a slight tension in your abdominal muscles to brace your spine. Your tailbone should point down to the ground, not behind your body, resulting in an over-extended swayback posture.  INCORRECT STANDING POSTURES  Common incorrect standing postures include a forward head, locked knees and/or an excessive  swayback. WALKING Walk with an upright posture. Your ears, shoulders and hips should all line-up. PROLONGED ACTIVITY IN A FLEXED POSITION When completing a task that requires you to bend forward at your waist or lean over a low surface, try to find a way to stabilize 3 out of 4 of your limbs. You can place a hand or elbow on your thigh or rest a knee on the surface you are reaching across. This will provide you more stability, so that your muscles do not tire as quickly. By keeping your knees relaxed, or slightly bent, you will also reduce stress across your lower back. CORRECT LIFTING TECHNIQUES DO :  Assume a wide stance. This will provide you more stability and the opportunity to get as close as possible to the object which you are lifting.  Tense your abdominals to brace your spine. Bend at the knees and hips. Keeping your back locked in a neutral-spine position, lift using your leg muscles. Lift with your legs, keeping your back straight.  Test the weight of unknown objects before attempting to lift them.  Try to keep your elbows locked down at your sides in order get the best strength from your shoulders when carrying an object.  Always ask for help when lifting heavy or awkward objects. INCORRECT LIFTING TECHNIQUES DO NOT:   Lock your knees when lifting, even if it is a small object.  Bend and twist. Pivot at your feet or move your feet when needing to change directions.  Assume that you can safely pick up even a paperclip without proper posture.   This information is not intended to replace advice given to you by your health care provider. Make sure you discuss any questions you have with your health care provider.   Document Released: 08/18/2005 Document Revised: 09/08/2014 Document Reviewed: 11/30/2008 [employer] Interactive Patient Education 2016 [employer]  

## 2015-11-02 ENCOUNTER — Ambulatory Visit (INDEPENDENT_AMBULATORY_CARE_PROVIDER_SITE_OTHER): Admitting: Family Medicine

## 2015-11-02 VITALS — BP 102/68 | HR 65 | Temp 98.5°F | Resp 16 | Ht 72.5 in | Wt 212.0 lb

## 2015-11-02 DIAGNOSIS — M545 Low back pain: Secondary | ICD-10-CM

## 2015-11-02 DIAGNOSIS — S39012A Strain of muscle, fascia and tendon of lower back, initial encounter: Secondary | ICD-10-CM

## 2015-11-02 DIAGNOSIS — S39012D Strain of muscle, fascia and tendon of lower back, subsequent encounter: Secondary | ICD-10-CM

## 2015-11-02 MED ORDER — METAXALONE 800 MG PO TABS: 800.0000 mg | ORAL_TABLET | Freq: Three times a day (TID) | ORAL | Status: DC

## 2015-11-02 MED ORDER — IBUPROFEN 800 MG PO TABS: 800.0000 mg | ORAL_TABLET | Freq: Three times a day (TID) | ORAL | Status: DC | PRN

## 2015-11-02 NOTE — Patient Instructions (Signed)
Continue using the ibuprofen 3 times daily as needed for pain and inflammation  Take the Skelaxin  at least at bedtime, but if tolerated take a half in the morning and a half in the afternoon also.  Continue lifting no more than 30-35 pounds at a time, lifting from the knees.  If they get the physical therapy going to think it will be helpful to you.  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  Hopefully we can get you to regular duty without restrictions by a couple of weeks from now, but I think it is worth continuing in this fashion to allow you to be able to work toward that goal.

## 2015-11-02 NOTE — Progress Notes (Addendum)
Patient ID: Donald Rodgers, male    DOB: [birth year]  Age: 45 y.o. MRN: [id number]  Chief Complaint  Patient presents with  . 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 His injury occurred on January 27. This is been his first full work week. He has worked with a lift restriction. Also has not been carrying his bag per Dr. Michaelle Copas instructions. That has helped. He does tighten up some is a goes on. He says in the morning is a little tight, loosens up. But by the end of the day he is hurting some. This is the end of the day when I'm checking him. His pain gets up to about a 6 out of 10 at times. It is tolerable.  Current allergies, medications, problem list, past/family and social histories reviewed.  Objective:  BP 102/68 mmHg  Pulse 65  Temp(Src) 98.5 F (36.9 C)  Resp 16  Ht 6' 0.5" (1.842 m)  Wt 212 lb (96.163 kg)  BMI 28.34 kg/m2  SpO2 99%  No major distress. He was down on his knees bending over the chair when I came in trying to be comfortable. He lives with a squatting position. He has pain and decreased motion on flexion and extension of his spine. Side-to-side tilt causes some pain rotation is good.  Assessment & Plan:   Assessment: 1. Back strain, subsequent encounter   2. Lumbar strain, subsequent encounter   3. Lumbar strain, initial encounter       Plan: Low back strain, gradually improving, is working but with limitations still. I think we should continue him on some work restrictions so they can work but try to avoid further injury. I think it will gradually improve with time, and this weekend after a week of work will probably help. He has worked about 60 hours in the last 5 days. I think that a couple of consecutive days off to try and let the back loosened backup would be good, so will leave him off tomorrow. 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 Hopefully in 2 weeks we can free him up for regular duty. Continue use of ibuprofen.  No orders of the defined types were placed in this encounter.    Meds ordered this encounter  Medications  . ibuprofen (ADVIL,MOTRIN) 800 MG tablet    Sig: Take 1 tablet (800 mg total) by mouth every 8 (eight) hours as needed.    Dispense:  40 tablet    Refill:  0  . metaxalone (SKELAXIN) 800 MG tablet    Sig: Take 1 tablet (800 mg total) by mouth 3 (three) times daily. As needed for back strain    Dispense:  40 tablet    Refill:  0         Patient Instructions  Continue using the ibuprofen 3 times daily as needed for pain and inflammation  Take the Skelaxin  at least at bedtime, but if tolerated take a half in the morning and a half in the afternoon also.  Continue lifting no more than 30-35 pounds at a time, lifting from the knees.  If they get the physical therapy going to think it will be helpful to you.  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  Hopefully we  can get you to regular duty without restrictions by a couple of weeks from now, but I think it is worth continuing in this fashion to allow you to be able to work toward that goal.     Return in about 2 weeks (around 11/16/2015).   Leen Tworek, MD 11/30/2015

## 2015-11-08 ENCOUNTER — Other Ambulatory Visit: Payer: Self-pay | Admitting: Family Medicine

## 2015-11-30 ENCOUNTER — Ambulatory Visit (INDEPENDENT_AMBULATORY_CARE_PROVIDER_SITE_OTHER): Admitting: Family Medicine

## 2015-11-30 VITALS — BP 114/70 | HR 48 | Temp 98.2°F | Resp 14 | Ht 72.0 in | Wt 221.0 lb

## 2015-11-30 DIAGNOSIS — M545 Low back pain: Secondary | ICD-10-CM | POA: Diagnosis not present

## 2015-11-30 NOTE — Patient Instructions (Addendum)
Continue current medications  Physical therapy referral is going to be reordered, requesting [place] area  Partial light duty is requested if available.  Return in 2 weeks    IF you received an x-ray today, you will receive an invoice from [facility]. Please contact [facility] at [phone] with questions or concerns regarding your invoice.   IF you received labwork today, you will receive an invoice from [facility]. Please contact [facility] at [phone] with questions or concerns regarding your invoice.   Our billing staff will not be able to assist you with questions regarding bills from these companies.  You will be contacted with the lab results as soon as they are available. The fastest way to get your results is to activate your My Chart account. Instructions are located on the last page of this paperwork. If you have not heard from us regarding the results in 2 weeks, please contact this office.

## 2015-11-30 NOTE — Progress Notes (Signed)
Patient ID: Donald MeyerBilly Rodgers, male    DOB: 04/18/1971  Age: 45 y.o. MRN: [id number]  Chief Complaint  Patient presents with  . Back Pain    w/c follow up    Subjective:   Patient continues to have a lot of pain in his low back, shooting around to the left side and left groin line area. He has continued to be working full duty. Physical therapy never got started. [place] so he could work and go to physical therapy. Apparently there was miscommunication, because the note says that he did not return a call so ultimately the referral was not completed. The patient is been asked to return in 2 weeks on his last instruction sheet, but today is 4 weeks. 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 He feels like he would be better off having to do that then he would just sitting in one place, but is hopeful that he can get some kind of intermediate activity position if placed on light duty.  Current allergies, medications, problem list, past/family and social histories reviewed.  Objective:  BP 114/70 mmHg  Pulse 48  Temp(Src) 98.2 F (36.8 C)  Resp 14  Ht 6' (1.829 m)  Wt 221 lb (100.245 kg)  BMI 29.97 kg/m2  No major distress. He is a little slow when he gets up and down. He was kneeling on his knees over his chair when I entered the room, a position that he finds more comfortable since he had been sitting in here waiting for a long time. Flexion goes only to about 45. His straight leg raising test is not truly positive but gets tight and painful at about 70 without radiculopathy. Other truncal motion continues to cause pain in the low back especially shooting around to the left side. He has most discomfort when he is sitting for prolonged times, does better when he stirs around a little bit.  Assessment & Plan:   Assessment: 1. Left low back pain, with sciatica presence unspecified        Plan: Back strain with very tight muscles. I think physical therapy will help him if we can get things worked out to get the physical therapy begun. We'll try that again.  No orders of the defined types were placed in this encounter.    No orders of the defined types were placed in this encounter.      Care took extended time 25 min trying to figure out the paperwork.     Patient Instructions   Continue current medications  Physical therapy referral is going to be reordered, requesting [place] area  Partial light duty is requested if available.  Return in 2 weeks    IF you received an x-ray today, you will receive an invoice from [facility]. Please contact [facility] at [phone] with questions or concerns regarding your invoice.   IF you received labwork today, you will receive an invoice from [facility]. Please contact [facility] at [phone] with questions or concerns regarding your invoice.   Our billing staff will not be able to assist you with questions regarding bills from these companies.  You will be contacted with the lab results as soon as they are available. The fastest way to get your results is to activate your My Chart account. Instructions are located on the last page of this paperwork. If  you have not heard from [place] regarding the results in 2 weeks, please contact this office.          Return in about 2 weeks (around 12/14/2015).   Narvel Kozub, MD 11/30/2015

## 2015-12-17 ENCOUNTER — Ambulatory Visit (INDEPENDENT_AMBULATORY_CARE_PROVIDER_SITE_OTHER): Admitting: Urgent Care

## 2015-12-17 VITALS — BP 112/78 | HR 48 | Temp 98.0°F | Resp 18 | Ht 72.0 in | Wt 210.8 lb

## 2015-12-17 DIAGNOSIS — M545 Low back pain, unspecified: Secondary | ICD-10-CM

## 2015-12-17 DIAGNOSIS — S39012D Strain of muscle, fascia and tendon of lower back, subsequent encounter: Secondary | ICD-10-CM

## 2015-12-17 MED ORDER — METHOCARBAMOL 750 MG PO TABS: 750.0000 mg | ORAL_TABLET | Freq: Three times a day (TID) | ORAL | Status: AC

## 2015-12-17 MED ORDER — NAPROXEN SODIUM 550 MG PO TABS: 550.0000 mg | ORAL_TABLET | Freq: Two times a day (BID) | ORAL | Status: AC

## 2015-12-17 NOTE — Progress Notes (Signed)
    MRN: [id number] DOB: 08/04/1971  Subjective:   Donald MeyerBilly Betts is a 45 y.o. male presenting for chief complaint of Follow-up and back injury  Today, he reports that he still has low back pain, left side that sometimes goes down to the left buttock. He has difficulty with prolonged sitting, standing from a sitting position, bending, lifting. He feels okay to walk but driving is difficult because of all the sitting and rising. He has not tried too many medications. He did not like Flexeril, Skelaxin. Has not tried to use ibuprofen. Patient is awaiting for his physical therapy referral to [facility]. [facility] to [facility] that up but unfortunately, they do not accept [employer]. Therefore, our staff will looking [facility].   Laverne's medications list, allergies, past medical history and past surgical history were reviewed and excluded from this note due to being a worker's comp case.  Objective:   Vitals: BP 112/78 mmHg  Pulse 48  Temp(Src) 98 F (36.7 C) (Oral)  Resp 18  Ht 6' (1.829 m)  Wt 210 lb 12.8 oz (95.618 kg)  BMI 28.58 kg/m2  SpO2 98%  Physical Exam  Constitutional: He is oriented to person, place, and time. He appears well-developed and well-nourished.  Cardiovascular: Normal rate.   Pulmonary/Chest: Effort normal.  Musculoskeletal:       Lumbar back: He exhibits decreased range of motion (flexion, extension), tenderness (mild over area depicted) and spasm. He exhibits no bony tenderness, no swelling, no edema, no deformity and no laceration.       Back:  Negative SLR.  Neurological: He is alert and oriented to person, place, and time. He has normal reflexes.  Skin: Skin is warm and dry.   Assessment and Plan :   1. Lumbar strain, subsequent encounter 2. Left-sided low back pain without sciatica - Referral to PT is pending, start Anaprox, Robaxin. RTC in 2 weeks if referral does not happen.  Wallis BambergMario Tam Delisle, PA-C [facility]  [facility] [phone] 12/17/2015 8:56 AM

## 2015-12-17 NOTE — Patient Instructions (Addendum)
Back Pain, Adult Back pain is very common in adults.The cause of back pain is rarely dangerous and the pain often gets better over time.The cause of your back pain may not be known. Some common causes of back pain include:  Strain of the muscles or ligaments supporting the spine.  Wear and tear (degeneration) of the spinal disks.  Arthritis.  Direct injury to the back. For many people, back pain may return. Since back pain is rarely dangerous, most people can learn to manage this condition on their own. HOME CARE INSTRUCTIONS Watch your back pain for any changes. The following actions may help to lessen any discomfort you are feeling:  Remain active. It is stressful on your back to sit or stand in one place for long periods of time. Do not sit, drive, or stand in one place for more than 30 minutes at a time. Take short walks on even surfaces as soon as you are able.Try to increase the length of time you walk each day.  Exercise regularly as directed by your health care provider. Exercise helps your back heal faster. It also helps avoid future injury by keeping your muscles strong and flexible.  Do not stay in bed.Resting more than 1-2 days can delay your recovery.  Pay attention to your body when you bend and lift. The most comfortable positions are those that put less stress on your recovering back. Always use proper lifting techniques, including:  Bending your knees.  Keeping the load close to your body.  Avoiding twisting.  Find a comfortable position to sleep. Use a firm mattress and lie on your side with your knees slightly bent. If you lie on your back, put a pillow under your knees.  Avoid feeling anxious or stressed.Stress increases muscle tension and can worsen back pain.It is important to recognize when you are anxious or stressed and learn ways to manage it, such as with exercise.  Take medicines only as directed by your health care provider. Over-the-counter  medicines to reduce pain and inflammation are often the most helpful.Your health care provider may prescribe muscle relaxant drugs.These medicines help dull your pain so you can more quickly return to your normal activities and healthy exercise.  Apply ice to the injured area:  Put ice in a plastic bag.  Place a towel between your skin and the bag.  Leave the ice on for 20 minutes, 2-3 times a day for the first 2-3 days. After that, ice and heat may be alternated to reduce pain and spasms.  Maintain a healthy weight. Excess weight puts extra stress on your back and makes it difficult to maintain good posture. SEEK MEDICAL CARE IF:  You have pain that is not relieved with rest or medicine.  You have increasing pain going down into the legs or buttocks.  You have pain that does not improve in one week.  You have night pain.  You lose weight.  You have a fever or chills. SEEK IMMEDIATE MEDICAL CARE IF:   You develop new bowel or bladder control problems.  You have unusual weakness or numbness in your arms or legs.  You develop nausea or vomiting.  You develop abdominal pain.  You feel faint.   This information is not intended to replace advice given to you by your health care provider. Make sure you discuss any questions you have with your health care provider.   Document Released: 08/18/2005 Document Revised: 09/08/2014 Document Reviewed: 12/20/2013 Elsevier Interactive Patient Education 2016 Elsevier   Inc.     IF you received an x-ray today, you will receive an invoice from [facility]. Please contact  Radiology at [phone] with questions or concerns regarding your invoice.   IF you received labwork today, you will receive an invoice from [facility]. Please contact [facility] at [phone] with questions or concerns regarding your invoice.   Our billing staff will not be able to assist you with questions regarding  bills from these companies.  You will be contacted with the lab results as soon as they are available. The fastest way to get your results is to activate your My Chart account. Instructions are located on the last page of this paperwork. If you have not heard from us regarding the results in 2 weeks, please contact this office.      

## 2016-02-04 ENCOUNTER — Ambulatory Visit (INDEPENDENT_AMBULATORY_CARE_PROVIDER_SITE_OTHER): Payer: Self-pay | Admitting: Physician Assistant

## 2016-02-04 VITALS — BP 110/64 | HR 58 | Temp 97.9°F | Resp 18 | Ht 72.0 in | Wt 210.4 lb

## 2016-02-04 DIAGNOSIS — S39012D Strain of muscle, fascia and tendon of lower back, subsequent encounter: Secondary | ICD-10-CM

## 2016-02-04 DIAGNOSIS — M545 Low back pain: Secondary | ICD-10-CM

## 2016-02-04 NOTE — Progress Notes (Addendum)
[facility] [address], [place] [address] [phone]  Date:  02/04/2016   Name:  Donald MeyerBilly Teagarden   DOB:  08/23/1971   MRN:  [id number]  PCP:  No primary care provider on file.   Chief Complaint  Patient presents with  . Follow-up    W/C recheck for PT   History of Present Illness:  Donald Rodgers is a 45 y.o. male patient who [facility].   Patient states that he is attending PT at this time for low lumbar strain.  He has no radiating pain down extremity.  He states that the pain has improved greatly.  Denies weakness, numbness or tingling.     History reviewed. No pertinent past surgical history.  Social History  Substance Use Topics  . Smoking status: Never Smoker   . Smokeless tobacco: None  . Alcohol Use: No    Family History  Problem Relation Age of Onset  . Stroke Mother   . Cancer Father     Allergies  Allergen Reactions  . Sulfa Antibiotics   . Sulfur     Medication list has been reviewed and updated.  Current Outpatient Prescriptions on File Prior to Visit  Medication Sig Dispense Refill  . Levetiracetam (KEPPRA XR) 750 MG TB24 Take 750 mg by mouth.    . methocarbamol (ROBAXIN) 750 MG tablet Take 1 tablet (750 mg total) by mouth 3 (three) times daily. 60 tablet 3  . naproxen sodium (ANAPROX DS) 550 MG tablet Take 1 tablet (550 mg total) by mouth 2 (two) times daily with a meal. 30 tablet 1   No current facility-administered medications on file prior to visit.    ROS ROS otherwise unremarkable unless listed above.   Physical Examination: BP 110/64 mmHg  Pulse 58  Temp(Src) 97.9 F (36.6 C) (Oral)  Resp 18  Ht 6' (1.829 m)  Wt 210 lb 6.4 oz (95.437 kg)  BMI 28.53 kg/m2  SpO2 99% Ideal Body Weight: Weight in (lb) to have BMI = 25: 183.9  Physical Exam  Constitutional: He is oriented to person, place, and time. He appears well-developed and well-nourished. No distress.  HENT:  Head:  Normocephalic and atraumatic.  Eyes: Conjunctivae and EOM are normal. Pupils are equal, round, and reactive to light.  Cardiovascular: Normal rate.   Pulmonary/Chest: Effort normal. No respiratory distress.  Musculoskeletal:       Lumbar back: He exhibits tenderness (left low lumbar). He exhibits normal range of motion and no bony tenderness.  Negative straight leg raise.  Neurological: He is alert and oriented to person, place, and time.  Skin: Skin is warm and dry. He is not diaphoretic.  Psychiatric: He has a normal mood and affect. His behavior is normal.  Nursing note reviewed.    Assessment and Plan: Donald MeyerBilly Bosak is a 45 y.o. male who is here today for cc of low back pain.   Form completed Left low back pain, with sciatica presence unspecified  Lumbar strain, subsequent encounter  Trena PlattStephanie Kynlei Piontek, PA-C [facility] 02/04/2016 9:12 AM

## 2016-02-04 NOTE — Patient Instructions (Signed)
     IF you received an x-ray today, you will receive an invoice from [facility]. Please contact [facility] at [phone] with questions or concerns regarding your invoice.   IF you received labwork today, you will receive an invoice from [facility]. Please contact [facility] at [phone] with questions or concerns regarding your invoice.   Our billing staff will not be able to assist you with questions regarding bills from these companies.  You will be contacted with the lab results as soon as they are available. The fastest way to get your results is to activate your My Chart account. Instructions are located on the last page of this paperwork. If you have not heard from us regarding the results in 2 weeks, please contact this office.      

## 2016-02-21 ENCOUNTER — Ambulatory Visit (INDEPENDENT_AMBULATORY_CARE_PROVIDER_SITE_OTHER): Payer: 59

## 2016-02-21 ENCOUNTER — Ambulatory Visit: Payer: 59 | Admitting: Physician Assistant

## 2016-02-21 ENCOUNTER — Encounter: Payer: Self-pay | Admitting: Physician Assistant

## 2016-02-21 ENCOUNTER — Ambulatory Visit (INDEPENDENT_AMBULATORY_CARE_PROVIDER_SITE_OTHER): Payer: 59 | Admitting: Physician Assistant

## 2016-02-21 VITALS — BP 140/88 | HR 64 | Temp 97.8°F | Resp 16 | Ht 72.0 in | Wt 208.0 lb

## 2016-02-21 DIAGNOSIS — M62838 Other muscle spasm: Secondary | ICD-10-CM

## 2016-02-21 DIAGNOSIS — M25512 Pain in left shoulder: Secondary | ICD-10-CM

## 2016-02-21 DIAGNOSIS — X503XXA Overexertion from repetitive movements, initial encounter: Secondary | ICD-10-CM

## 2016-02-21 DIAGNOSIS — S39012D Strain of muscle, fascia and tendon of lower back, subsequent encounter: Secondary | ICD-10-CM

## 2016-02-21 DIAGNOSIS — S39012A Strain of muscle, fascia and tendon of lower back, initial encounter: Secondary | ICD-10-CM

## 2016-02-21 DIAGNOSIS — M545 Low back pain, unspecified: Secondary | ICD-10-CM

## 2016-02-21 DIAGNOSIS — M6248 Contracture of muscle, other site: Secondary | ICD-10-CM

## 2016-02-21 MED ORDER — CYCLOBENZAPRINE HCL 5 MG PO TABS: 5.0000 mg | ORAL_TABLET | Freq: Three times a day (TID) | ORAL | Status: AC | PRN

## 2016-02-21 NOTE — Progress Notes (Signed)
[facility] [address], [place] [address] [phone]  Date:  02/21/2016   Name:  Donald MeyerBilly Rodgers   DOB:  12/07/1970   MRN:  [id number]  PCP:  No primary care provider on file.    History of Present Illness:  Donald Rodgers is a 45 y.o. male patient who [facility] pain [facility].  He has been strengthening his core with PT.  Estimated time is for 2 weeks, until full recovery, according to his physical therapist.  He has been having less pain at the lower lumbar.  He may have some shooting pain to left thigh.  No weakness of lower extremities.  No incontinence.  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         Patient Active Problem List   Diagnosis Date Noted  . Epilepsy (HCC) 10/16/2015    Past Medical History  Diagnosis Date  . Allergy   . Seizures (HCC)     No past surgical history on file.  Social History  Substance Use Topics  . Smoking status: Never Smoker   . Smokeless tobacco: None  . Alcohol Use: No    Family History  Problem Relation Age of Onset  . Stroke Mother   . Cancer Father     Allergies  Allergen Reactions  . Sulfa Antibiotics   . Sulfur     Medication list has been reviewed and updated.  Current Outpatient Prescriptions on File Prior to Visit  Medication Sig Dispense Refill  . Levetiracetam (KEPPRA XR) 750 MG TB24 Take 750 mg by mouth. Reported on 02/21/2016    . methocarbamol (ROBAXIN) 750 MG tablet Take 1 tablet (750 mg total) by mouth 3 (three) times daily. (Patient not taking: Reported on 02/21/2016) 60 tablet 3  . naproxen sodium (ANAPROX DS) 550 MG tablet Take 1 tablet (550 mg total) by mouth 2 (two) times daily with a meal. (Patient not taking: Reported on 02/21/2016) 30 tablet 1   No current facility-administered medications on file prior to visit.    ROS   Physical  Examination: BP 140/88 mmHg  Pulse 64  Temp(Src) 97.8 F (36.6 C) (Oral)  Resp 16  Ht 6' (1.829 m)  Wt 208 lb (94.348 kg)  BMI 28.20 kg/m2  SpO2 98% Ideal Body Weight: Weight in (lb) to have BMI = 25: 183.9  Physical Exam  Constitutional: He is oriented to person, place, and time. He appears well-developed and well-nourished. No distress.  HENT:  Head: Normocephalic and atraumatic.  Eyes: Conjunctivae and EOM are normal. Pupils are equal, round, and reactive to light.  Cardiovascular: Normal rate.   Pulmonary/Chest: Effort normal. No respiratory distress.  Musculoskeletal:       Lumbar back: He exhibits normal range of motion, no tenderness, no bony tenderness and no spasm.  Neurological: He is alert and oriented to person, place, and time.  Skin: Skin is warm and dry. He is not diaphoretic.  Psychiatric: He has a normal mood and affect. His behavior is normal.     Assessment and Plan: Donald MeyerBilly Shimabukuro is a 45 y.o. male who is here today for follow up of lower back pain.  This has been improving.  I have advised that he return to the clinic in 2 weeks.  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  Form completed. Continue physical therapy  Lumbar strain, subsequent encounter - Plan: Ambulatory referral to Physical Therapy  Repetitive strain injury of lower back, initial encounter - Plan: Ambulatory referral to Physical Therapy  Back strain, subsequent encounter - Plan: Ambulatory referral to Physical Therapy  Left-sided low back pain without sciatica - Plan: Ambulatory referral to Physical Therapy   Trena PlattStephanie English, PA-C [facility] 02/21/2016 8:51 AM

## 2016-02-21 NOTE — Patient Instructions (Signed)
Please await contact for the referral.  Generic Shoulder Exercises EXERCISES  RANGE OF MOTION (ROM) AND STRETCHING EXERCISES These exercises may help you when beginning to rehabilitate your injury. Your symptoms may resolve with or without further involvement from your physician, physical therapist or athletic trainer. While completing these exercises, remember:   Restoring tissue flexibility helps normal motion to return to the joints. This allows healthier, less painful movement and activity.  An effective stretch should be held for at least 30 seconds.  A stretch should never be painful. You should only feel a gentle lengthening or release in the stretched tissue. ROM - Pendulum  Bend at the waist so that your right / left arm falls away from your body. Support yourself with your opposite hand on a solid surface, such as a table or a countertop.  Your right / left arm should be perpendicular to the ground. If it is not perpendicular, you need to lean over farther. Relax the muscles in your right / left arm and shoulder as much as possible.  Gently sway your hips and trunk so they move your right / left arm without any use of your right / left shoulder muscles.  Progress your movements so that your right / left arm moves side to side, then forward and backward, and finally, both clockwise and counterclockwise.  Complete __________ repetitions in each direction. Many people use this exercise to relieve discomfort in their shoulder as well as to gain range of motion. Repeat __________ times. Complete this exercise __________ times per day. STRETCH - Flexion, Standing  Stand with good posture. With an underhand grip on your right / left hand and an overhand grip on the opposite hand, grasp a broomstick or cane so that your hands are a little more than shoulder-width apart.  Keeping your right / left elbow straight and shoulder muscles relaxed, push the stick with your opposite hand to  raise your right / left arm in front of your body and then overhead. Raise your arm until you feel a stretch in your right / left shoulder, but before you have increased shoulder pain.  Try to avoid shrugging your right / left shoulder as your arm rises by keeping your shoulder blade tucked down and toward your mid-back spine. Hold __________ seconds.  Slowly return to the starting position. Repeat __________ times. Complete this exercise __________ times per day. STRETCH - Internal Rotation  Place your right / left hand behind your back, palm-up.  Throw a towel or belt over your opposite shoulder. Grasp the towel/belt with your right / left hand.  While keeping an upright posture, gently pull up on the towel/belt until you feel a stretch in the front of your right / left shoulder.  Avoid shrugging your right / left shoulder as your arm rises by keeping your shoulder blade tucked down and toward your mid-back spine.  Hold __________. Release the stretch by lowering your opposite hand. Repeat __________ times. Complete this exercise __________ times per day. STRETCH - External Rotation and Abduction  Stagger your stance through a doorframe. It does not matter which foot is forward.  As instructed by your physician, physical therapist or athletic trainer, place your hands:  And forearms above your head and on the door frame.  And forearms at head-height and on the door frame.  At elbow-height and on the door frame.  Keeping your head and chest upright and your stomach muscles tight to prevent over-extending your low-back, slowly shift your  weight onto your front foot until you feel a stretch across your chest and/or in the front of your shoulders.  Hold __________ seconds. Shift your weight to your back foot to release the stretch. Repeat __________ times. Complete this stretch __________ times per day.  STRENGTHENING EXERCISES  These exercises may help you when beginning to  rehabilitate your injury. They may resolve your symptoms with or without further involvement from your physician, physical therapist or athletic trainer. While completing these exercises, remember:   Muscles can gain both the endurance and the strength needed for everyday activities through controlled exercises.  Complete these exercises as instructed by your physician, physical therapist or athletic trainer. Progress the resistance and repetitions only as guided.  You may experience muscle soreness or fatigue, but the pain or discomfort you are trying to eliminate should never worsen during these exercises. If this pain does worsen, stop and make certain you are following the directions exactly. If the pain is still present after adjustments, discontinue the exercise until you can discuss the trouble with your clinician.  If advised by your physician, during your recovery, avoid activity or exercises which involve actions that place your right / left hand or elbow above your head or behind your back or head. These positions stress the tissues which are trying to heal. STRENGTH - Scapular Depression and Adduction  With good posture, sit on a firm chair. Supported your arms in front of you with pillows, arm rests or a table top. Have your elbows in line with the sides of your body.  Gently draw your shoulder blades down and toward your mid-back spine. Gradually increase the tension without tensing the muscles along the top of your shoulders and the back of your neck.  Hold for __________ seconds. Slowly release the tension and relax your muscles completely before completing the next repetition.  After you have practiced this exercise, remove the arm support and complete it in standing as well as sitting. Repeat __________ times. Complete this exercise __________ times per day.  STRENGTH - External Rotators  Secure a rubber exercise band/tubing to a fixed object so that it is at the same height as  your right / left elbow when you are standing or sitting on a firm surface.  Stand or sit so that the secured exercise band/tubing is at your side that is not injured.  Bend your elbow 90 degrees. Place a folded towel or small pillow under your right / left arm so that your elbow is a few inches away from your side.  Keeping the tension on the exercise band/tubing, pull it away from your body, as if pivoting on your elbow. Be sure to keep your body steady so that the movement is only coming from your shoulder rotating.  Hold __________ seconds. Release the tension in a controlled manner as you return to the starting position. Repeat __________ times. Complete this exercise __________ times per day.  STRENGTH - Supraspinatus  Stand or sit with good posture. Grasp a __________ weight or an exercise band/tubing so that your hand is "thumbs-up," like when you shake hands.  Slowly lift your right / left hand from your thigh into the air, traveling about 30 degrees from straight out at your side. Lift your hand to shoulder height or as far as you can without increasing any shoulder pain. Initially, many people do not lift their hands above shoulder height.  Avoid shrugging your right / left shoulder as your arm rises by keeping  your shoulder blade tucked down and toward your mid-back spine.  Hold for __________ seconds. Control the descent of your hand as you slowly return to your starting position. Repeat __________ times. Complete this exercise __________ times per day.  STRENGTH - Shoulder Extensors  Secure a rubber exercise band/tubing so that it is at the height of your shoulders when you are either standing or sitting on a firm arm-less chair.  With a thumbs-up grip, grasp an end of the band/tubing in each hand. Straighten your elbows and lift your hands straight in front of you at shoulder height. Step back away from the secured end of band/tubing until it becomes tense.  Squeezing your  shoulder blades together, pull your hands down to the sides of your thighs. Do not allow your hands to go behind you.  Hold for __________ seconds. Slowly ease the tension on the band/tubing as you reverse the directions and return to the starting position. Repeat __________ times. Complete this exercise __________ times per day.  STRENGTH - Scapular Retractors  Secure a rubber exercise band/tubing so that it is at the height of your shoulders when you are either standing or sitting on a firm arm-less chair.  With a palm-down grip, grasp an end of the band/tubing in each hand. Straighten your elbows and lift your hands straight in front of you at shoulder height. Step back away from the secured end of band/tubing until it becomes tense.  Squeezing your shoulder blades together, draw your elbows back as you bend them. Keep your upper arm lifted away from your body throughout the exercise.  Hold __________ seconds. Slowly ease the tension on the band/tubing as you reverse the directions and return to the starting position. Repeat __________ times. Complete this exercise __________ times per day. STRENGTH - Scapular Depressors  Find a sturdy chair without wheels, such as a from a dining room table.  Keeping your feet on the floor, lift your bottom from the seat and lock your elbows.  Keeping your elbows straight, allow gravity to pull your body weight down. Your shoulders will rise toward your ears.  Raise your body against gravity by drawing your shoulder blades down your back, shortening the distance between your shoulders and ears. Although your feet should always maintain contact with the floor, your feet should progressively support less body weight as you get stronger.  Hold __________ seconds. In a controlled and slow manner, lower your body weight to begin the next repetition. Repeat __________ times. Complete this exercise __________ times per day.    This information is not intended  to replace advice given to you by your health care provider. Make sure you discuss any questions you have with your health care provider.   Document Released: 07/02/2005 Document Revised: 09/08/2014 Document Reviewed: 11/30/2008 Elsevier Interactive Patient Education [employer]

## 2016-02-21 NOTE — Progress Notes (Signed)
[facility] [address], [place] [id number] [phone]  Date:  02/21/2016   Name:  Donald MeyerBilly Rodgers   DOB:  08/26/1971   MRN:  [id number]  PCP:  No primary care provider on file.    History of Present Illness:  Donald Rodgers is a 45 y.o. male patient who [facility] chief complaint of shoulder pain. Patient reports that he has been having the shoulder pain of his left side for years now. 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 He wears a shoulder bag that sits on the left shoulder, holding as much as 150-300lbs??  He can not use his right side due to not having the dexterity to run the route with his left hand.  He has pain in the back of his shoulder and across his trapezius. There are times where the pain radiates all the way down to his hands and a numb feeling and tingling sensation. He denies any weakness. There is no swelling. However, he has noticed that his left shoulder will be up higher than his right shoulder when looking at his silhouette. He has seen a provider years ago for this but did not come to any formal diagnosis. He works out consistently.     Patient Active Problem List   Diagnosis Date Noted  . Epilepsy (HCC) 10/16/2015    Past Medical History  Diagnosis Date  . Allergy   . Seizures (HCC)     No past surgical history on file.  Social History  Substance Use Topics  . Smoking status: Never Smoker   . Smokeless tobacco: Not on file  . Alcohol Use: No    Family History  Problem Relation Age of Onset  . Stroke Mother   . Cancer Father     Allergies  Allergen Reactions  . Sulfa Antibiotics   . Sulfur     Medication list has been reviewed and updated.  Current Outpatient Prescriptions on File Prior to Visit  Medication Sig Dispense Refill  . Levetiracetam (KEPPRA XR) 750 MG TB24 Take 750 mg by mouth. Reported on 02/21/2016    . methocarbamol (ROBAXIN) 750 MG tablet Take 1 tablet (750  mg total) by mouth 3 (three) times daily. (Patient not taking: Reported on 02/21/2016) 60 tablet 3  . naproxen sodium (ANAPROX DS) 550 MG tablet Take 1 tablet (550 mg total) by mouth 2 (two) times daily with a meal. (Patient not taking: Reported on 02/21/2016) 30 tablet 1   No current facility-administered medications on file prior to visit.    ROS ROS otherwise unremarkable unless listed above.   Physical Examination: There were no vitals taken for this visit. Ideal Body Weight:    Physical Exam  Constitutional: He is oriented to person, place, and time. He appears well-developed and well-nourished. No distress.  HENT:  Head: Normocephalic and atraumatic.  Eyes: Conjunctivae and EOM are normal. Pupils are equal, round, and reactive to light.  Cardiovascular: Normal rate.   Pulmonary/Chest: Effort normal. No respiratory distress.  Musculoskeletal:  Left shoulder with tenderness at the posterior area just medial to deltoid no bicipital groove tenderness. Positive Neer's and Hawkins. There is pain with external rotation of the shoulder as well. Range of motion is limited in external rotation to the 110.  Trapezius is tender and there is palpable tensity of the musculature.  Neurological: He is alert and oriented to person, place, and time.  Skin: Skin is warm  and dry. He is not diaphoretic.  Psychiatric: He has a normal mood and affect. His behavior is normal.   Dg Shoulder Left  02/21/2016  CLINICAL DATA:  Chronic posterior deltoid region left shoulder pain EXAM: LEFT SHOULDER - 2+ VIEW COMPARISON:  None in PACs FINDINGS: The bones are adequately mineralized. There are small marginal osteophytes associated with the superior and inferior aspects of the glenoid. The humeral head appears smoothly rounded. There is mild irregularity of the articular margins of the AC joint. The subacromial subdeltoid space appears normal. No acute or old fracture is observed. IMPRESSION: There is no acute bony  abnormality of the left shoulder. There are mild degenerative changes of the [facility] joint and glenohumeral joint. Electronically Signed   By: David  [place] M.D.   On: 02/21/2016 09:34     Assessment and Plan: Donald MeyerBilly Rodgers is a 45 y.o. male who is here today for cc of shoulder pain of left side. Differential diagnosis includes nerve impingement, rotator cuff tear, labrum tear, glenohumeral joint. This is possibly accentuated by his long-term use of shoulder bags with his work. I have advised him to use Tylenol for the pain. Muscle relaxant also given.  Discussed sedative se precautions.  I'm referring him to physical therapy at this time. If his shoulder pain does not improve he will need to see ortho as next step.   Pain in joint of left shoulder - Plan: DG Shoulder Left, Ambulatory referral to Physical Therapy, CANCELED: Ambulatory referral to Physical Therapy  Trapezius muscle spasm - Plan: cyclobenzaprine (FLEXERIL) 5 MG tablet, Ambulatory referral to Physical Therapy, CANCELED: Ambulatory referral to Physical Therapy  Trena PlattStephanie English, PA-C [facility] 6/22/20177:13 PM

## 2016-02-21 NOTE — Patient Instructions (Signed)
     IF you received an x-ray today, you will receive an invoice from [facility]. Please contact [facility] at [phone] with questions or concerns regarding your invoice.   IF you received labwork today, you will receive an invoice from [facility]. Please contact [facility] at [phone] with questions or concerns regarding your invoice.   Our billing staff will not be able to assist you with questions regarding bills from these companies.  You will be contacted with the lab results as soon as they are available. The fastest way to get your results is to activate your My Chart account. Instructions are located on the last page of this paperwork. If you have not heard from us regarding the results in 2 weeks, please contact this office.      

## 2017-09-24 IMAGING — CR DG LUMBAR SPINE COMPLETE 4+V
5 series · 5 of 5 positions shown · non-contrast
Comparison: None.

CLINICAL DATA: Right-sided low back pain without sciatica.

EXAM:
LUMBAR SPINE - COMPLETE 4+ VIEW

[AP (1 of 2)]
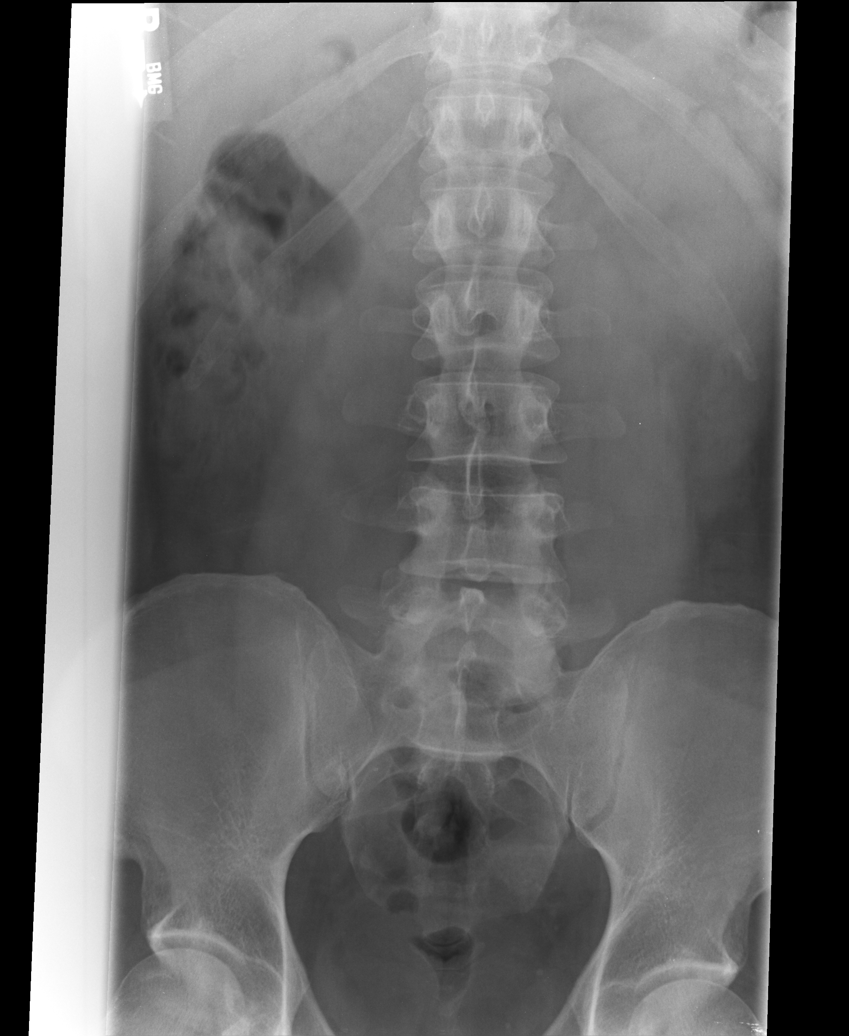

[AP (2 of 2)]
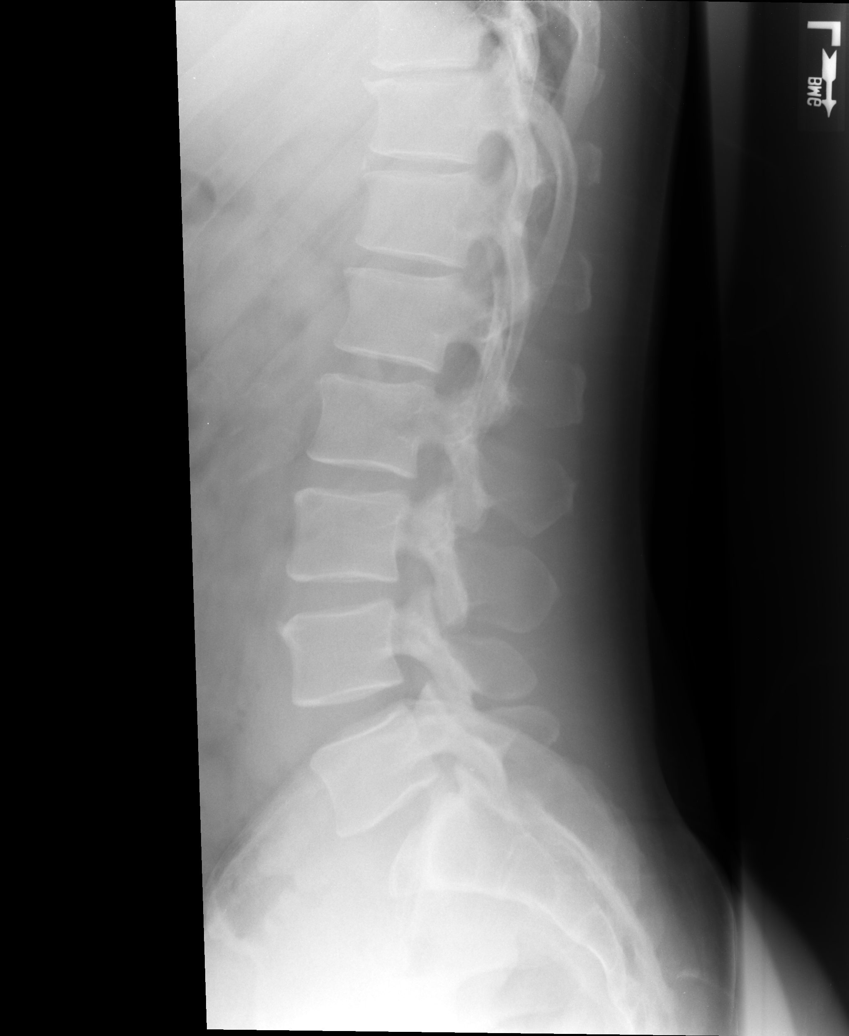

[l5 s1]
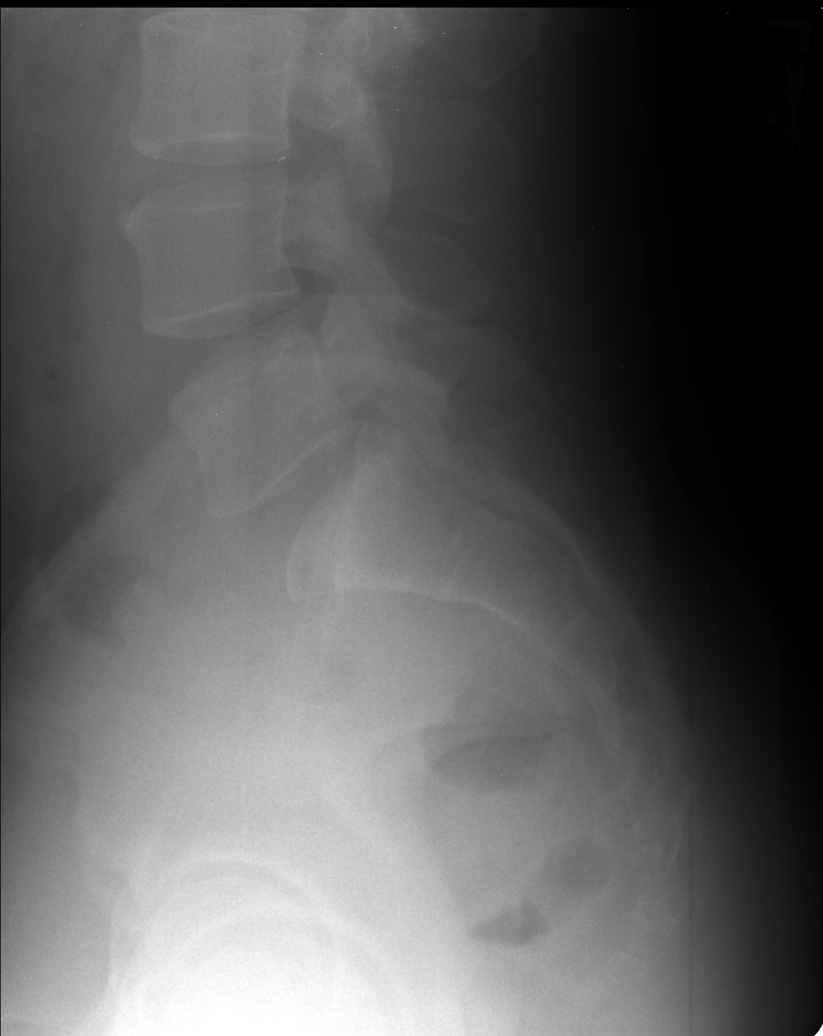

[rpo]
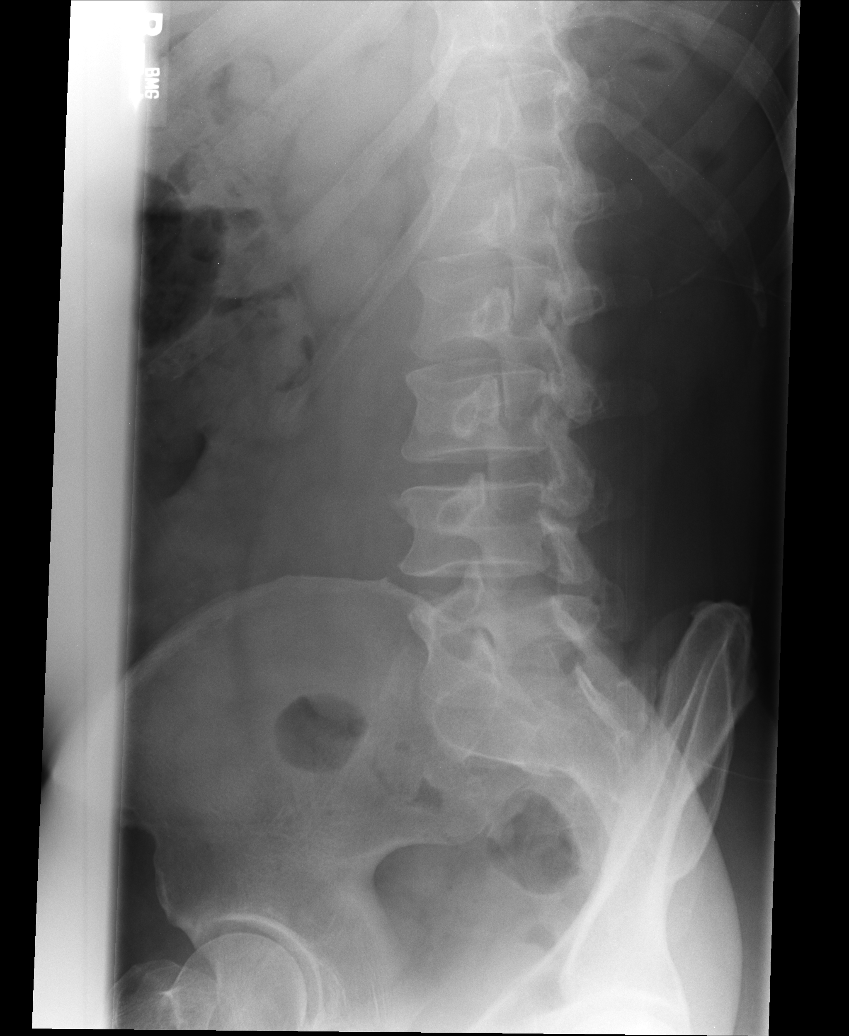

[lpo]
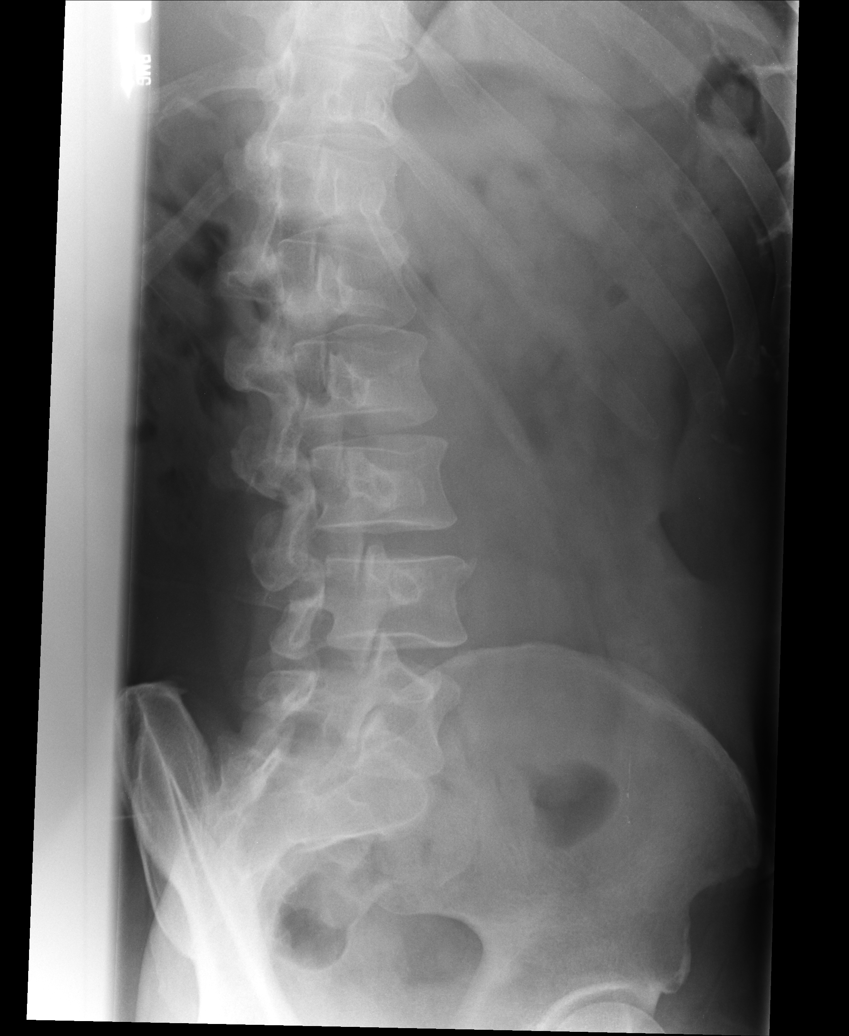

[5 of 5 positions shown; findings below may reference images not displayed]

FINDINGS: There is no evidence of lumbar spine fracture. Alignment is normal.
Intervertebral disc spaces are maintained. No facet arthritis.
IMPRESSION: No significant abnormalities of the lumbar spine.
